# Patient Record
Sex: Female | Born: 2000 | Race: Black or African American | Hispanic: No | Marital: Single | Smoking: Never smoker
Health system: Southern US, Community
[De-identification: ages and names within clinical notes are randomized; demographics above are authoritative.]

## PROBLEM LIST (undated history)

## (undated) DIAGNOSIS — R011 Cardiac murmur, unspecified: Secondary | ICD-10-CM

## (undated) HISTORY — PX: NO PAST SURGERIES: SHX2092

---
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
## 2023-09-12 ENCOUNTER — Ambulatory Visit: Payer: Self-pay | Admitting: Family Medicine

## 2024-01-13 ENCOUNTER — Ambulatory Visit: Admitting: Physician Assistant
